# Patient Record
Sex: Male | Born: 1995 | Race: Black or African American | Hispanic: No | Marital: Single | Smoking: Never smoker
Health system: Southern US, Community
[De-identification: ages and names within clinical notes are randomized; demographics above are authoritative.]

## PROBLEM LIST (undated history)

## (undated) DIAGNOSIS — J45909 Unspecified asthma, uncomplicated: Secondary | ICD-10-CM

## (undated) HISTORY — DX: Unspecified asthma, uncomplicated: J45.909

---

## 2014-11-08 ENCOUNTER — Emergency Department (HOSPITAL_COMMUNITY): Payer: BC Managed Care – PPO

## 2014-11-08 ENCOUNTER — Encounter (HOSPITAL_COMMUNITY): Payer: Self-pay | Admitting: Emergency Medicine

## 2014-11-08 ENCOUNTER — Emergency Department (HOSPITAL_COMMUNITY)
Admission: EM | Admit: 2014-11-08 | Discharge: 2014-11-08 | Disposition: A | Discharge status: Home / Self Care | Payer: BC Managed Care – PPO | Attending: Emergency Medicine | Admitting: Emergency Medicine

## 2014-11-08 DIAGNOSIS — T1490XA Injury, unspecified, initial encounter: Secondary | ICD-10-CM

## 2014-11-08 DIAGNOSIS — Y92310 Basketball court as the place of occurrence of the external cause: Secondary | ICD-10-CM | POA: Insufficient documentation

## 2014-11-08 DIAGNOSIS — S022XXA Fracture of nasal bones, initial encounter for closed fracture: Secondary | ICD-10-CM

## 2014-11-08 DIAGNOSIS — S0031XA Abrasion of nose, initial encounter: Secondary | ICD-10-CM | POA: Diagnosis not present

## 2014-11-08 DIAGNOSIS — Y998 Other external cause status: Secondary | ICD-10-CM | POA: Insufficient documentation

## 2014-11-08 DIAGNOSIS — S0993XA Unspecified injury of face, initial encounter: Secondary | ICD-10-CM | POA: Diagnosis present

## 2014-11-08 DIAGNOSIS — Y9367 Activity, basketball: Secondary | ICD-10-CM | POA: Diagnosis not present

## 2014-11-08 DIAGNOSIS — W51XXXA Accidental striking against or bumped into by another person, initial encounter: Secondary | ICD-10-CM | POA: Insufficient documentation

## 2014-11-08 MED ORDER — AMOXICILLIN-POT CLAVULANATE 875-125 MG PO TABS
1.0000 | ORAL_TABLET | Freq: Once | ORAL | Status: AC
  Administered 2014-11-08: 1 via ORAL
  Filled 2014-11-08: qty 1

## 2014-11-08 MED ORDER — AMOXICILLIN-POT CLAVULANATE 875-125 MG PO TABS: 1.0000 | ORAL_TABLET | Freq: Two times a day (BID) | ORAL | Status: DC

## 2014-11-08 NOTE — ED Provider Notes (Signed)
CSN: [id number]     Arrival date & time 11/08/14  1932 History  This chart was scribed for non-physician practitioner, Arman FilterGail K Romy Ipock, NP, working with Purvis SheffieldForrest Harrison, MD, by Modena JanskyAlbert Thayil, ED Scribe. This patient was seen in room WTR5/WTR5 and the patient's care was started at 8:12 PM.   Chief Complaint  Patient presents with  . Facial Injury   The history is provided by the patient. No language interpreter was used.   HPI Comments: Gabriel Barber is a 19 y.o. male who presents to the Emergency Department complaining of a facial injury that occurred about 2 hours ago. He reports that he was elbowed in the nose while playing basketball about 2 hours ago. He denies any LOC. He states that his nose started bleeding from his right nare with pain.   History reviewed. No pertinent past medical history. History reviewed. No pertinent past surgical history. No family history on file. History  Substance Use Topics  . Smoking status: Never Smoker   . Smokeless tobacco: Not on file  . Alcohol Use: No    Review of Systems  HENT: Positive for nosebleeds.   Skin: Positive for wound.  Neurological: Positive for headaches.  All other systems reviewed and are negative.   Allergies  Review of patient's allergies indicates no known allergies.  Home Medications   Prior to Admission medications   Medication Sig Start Date End Date Taking? Authorizing Provider  amoxicillin-clavulanate (AUGMENTIN) 875-125 MG per tablet Take 1 tablet by mouth every 12 (twelve) hours. 11/08/14   Arman FilterGail K Lilymarie Scroggins, NP   BP 134/87 mmHg  Pulse 55  Temp(Src) 97.7 F (36.5 C) (Oral)  Resp 16  Ht 5\' 11"  (1.803 m)  Wt 170 lb (77.111 kg)  BMI 23.72 kg/m2  SpO2 100% Physical Exam  Constitutional: He is oriented to person, place, and time. He appears well-developed and well-nourished. No distress.  HENT:  Head: Normocephalic.  Right Ear: External ear normal.  Left Ear: External ear normal.  Nose: Sinus tenderness  present. No septal deviation or nasal septal hematoma. Epistaxis is observed.    Mouth/Throat: Oropharynx is clear and moist.  Neck: Normal range of motion.  Cardiovascular: Normal rate.   Pulmonary/Chest: Effort normal. No respiratory distress.  Musculoskeletal: Normal range of motion.  Neurological: He is alert and oriented to person, place, and time.  Skin: Skin is warm and dry.  Psychiatric: He has a normal mood and affect.  Nursing note and vitals reviewed.   ED Course  Procedures (including critical care time) DIAGNOSTIC STUDIES: Oxygen Saturation is 100% on RA, normal by my interpretation.    COORDINATION OF CARE: 8:16 PM- Pt advised of plan for treatment which includes radiology and pt agrees.  Labs Review Labs Reviewed - No data to display  Imaging Review Dg Nasal Bones  11/08/2014   CLINICAL DATA:  Nasal pain, elbowed in face playing basketball today, trauma  EXAM: NASAL BONES - 3+ VIEW  COMPARISON:  None  FINDINGS: Nasal septal deviation to the LEFT.  Mildly depressed distal nasal bone fracture.  No additional fracture identified.  Anterior maxillary spine appears intact.  Mucosal thickening LEFT maxillary sinus.  IMPRESSION: Minimally displaced distal nasal bone fracture.   Electronically Signed   By: Ulyses SouthwardMark  Boles M.D.   On: 11/08/2014 21:22     EKG Interpretation None      MDM   Final diagnoses:  Trauma  Nasal bone fracture, closed, initial encounter    I personally performed the services  described in this documentation, which was scribed in my presence. The recorded information has been reviewed and is accurate.    Arman Filter, NP 11/08/14 2158  Purvis Sheffield, MD 11/10/14 [phone]

## 2014-11-08 NOTE — ED Notes (Signed)
Patient talking on cell phone and appears in no acute distress.

## 2014-11-08 NOTE — ED Notes (Signed)
Pt states he was elbowed in nose while playing basketball @ 1755, denies LOC. Deformity noted to bridge of nose, blood dripping from R nare

## 2014-11-08 NOTE — Discharge Instructions (Signed)
Blunt Trauma You have been evaluated for injuries. You have been examined and your caregiver has not found injuries serious enough to require hospitalization. It is common to have multiple bruises and sore muscles following an accident. These tend to feel worse for the first 24 hours. You will feel more stiffness and soreness over the next several hours and worse when you wake up the first morning after your accident. After this point, you should begin to improve with each passing day. The amount of improvement depends on the amount of damage done in the accident. Following your accident, if some part of your body does not work as it should, or if the pain in any area continues to increase, you should return to the Emergency Department for re-evaluation.  HOME CARE INSTRUCTIONS  Routine care for sore areas should include:  Ice to sore areas every 2 hours for 20 minutes while awake for the next 2 days.  Drink extra fluids (not alcohol).  Take a hot or warm shower or bath once or twice a day to increase blood flow to sore muscles. This will help you "limber up".  Activity as tolerated. Lifting may aggravate neck or back pain.  Only take over-the-counter or prescription medicines for pain, discomfort, or fever as directed by your caregiver. Do not use aspirin. This may increase bruising or increase bleeding if there are small areas where this is happening. SEEK IMMEDIATE MEDICAL CARE IF:  Numbness, tingling, weakness, or problem with the use of your arms or legs.  A severe headache is not relieved with medications.  There is a change in bowel or bladder control.  Increasing pain in any areas of the body.  Short of breath or dizzy.  Nauseated, vomiting, or sweating.  Increasing belly (abdominal) discomfort.  Blood in urine, stool, or vomiting blood.  Pain in either shoulder in an area where a shoulder strap would be.  Feelings of lightheadedness or if you have a fainting  episode. Sometimes it is not possible to identify all injuries immediately after the trauma. It is important that you continue to monitor your condition after the emergency department visit. If you feel you are not improving, or improving more slowly than should be expected, call your physician. If you feel your symptoms (problems) are worsening, return to the Emergency Department immediately. Document Released: 07/10/2001 Document Revised: 01/06/2012 Document Reviewed: 06/01/2008 [facility]ExitCare Patient Information 2015 [employer] [place]. This information is not intended to replace advice given to you by your health care provider. Make sure you discuss any questions you have with your health care provider. The distal tip of your nose is broken given, given a referral for follow-up with ENT once the swelling goes down.  You've also been given a prescription for antibiotic to prevent any infection

## 2015-11-29 IMAGING — CR DG NASAL BONES 3+V
3 series · 3 of 3 positions shown · non-contrast
Comparison: None

CLINICAL DATA: Nasal pain, elbowed in face playing basketball
today, trauma

EXAM:
NASAL BONES - 3+ VIEW

[w waters pa]
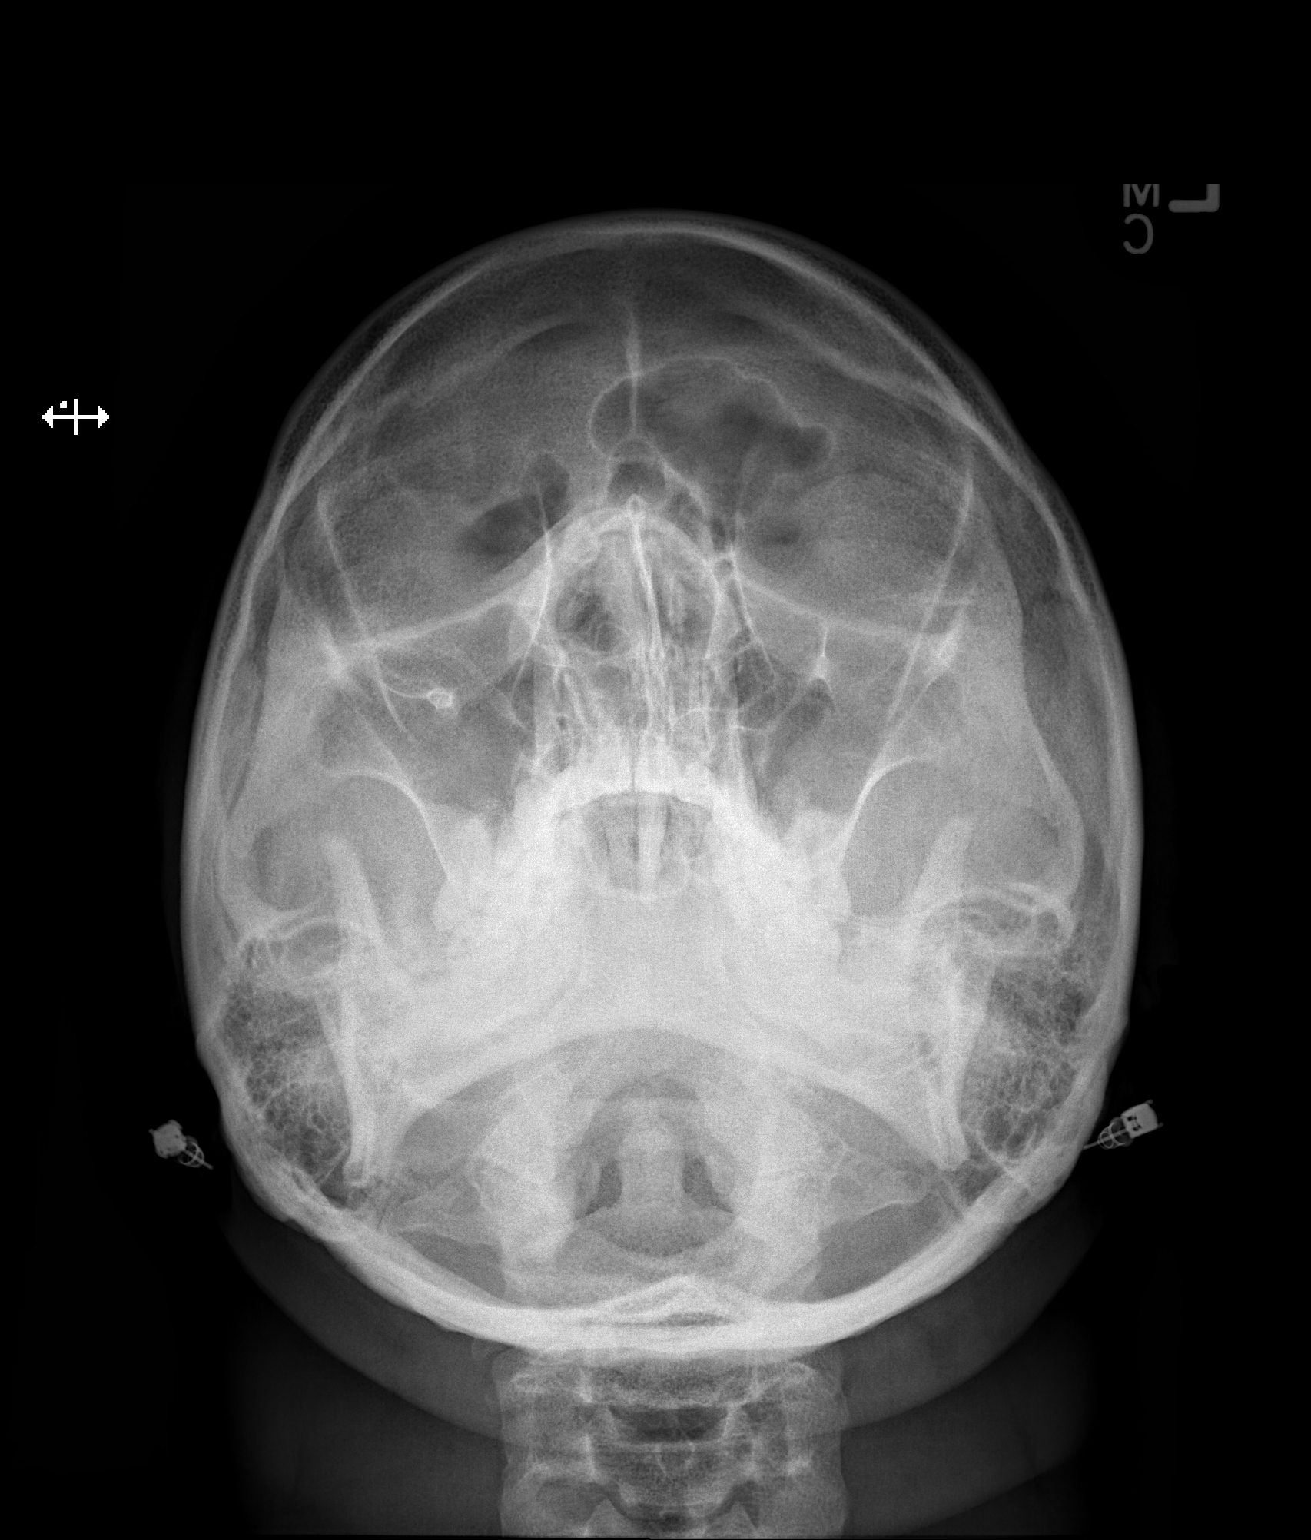

[w nasal bone lat (1 of 2)]
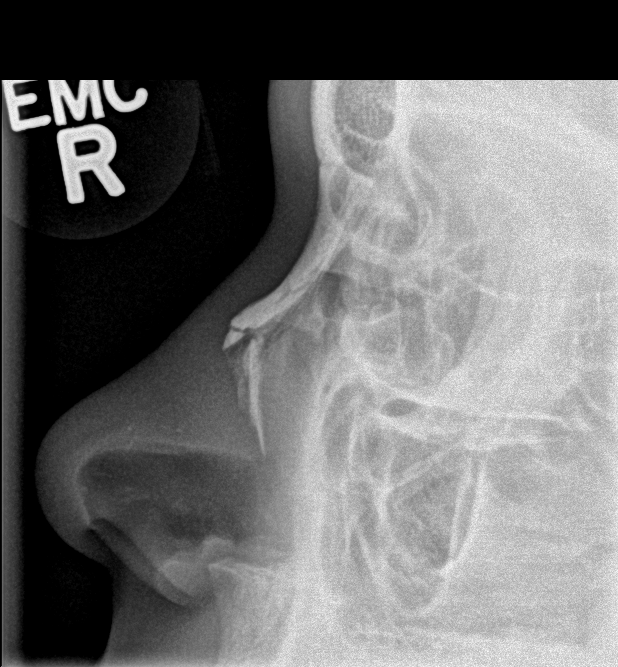

[w nasal bone lat (2 of 2)]
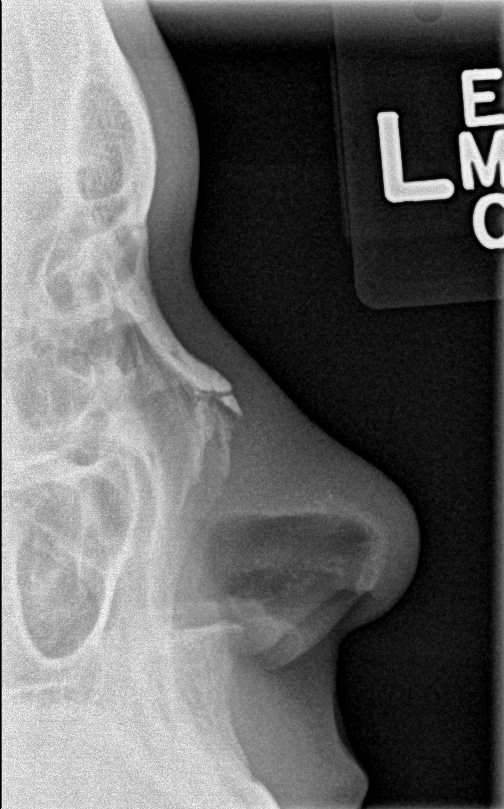

[3 of 3 positions shown; findings below may reference images not displayed]

FINDINGS: Nasal septal deviation to the LEFT.

Mildly depressed distal nasal bone fracture.

No additional fracture identified.

Anterior maxillary spine appears intact.

Mucosal thickening LEFT maxillary sinus.
IMPRESSION: Minimally displaced distal nasal bone fracture.

## 2016-01-21 ENCOUNTER — Ambulatory Visit: Payer: PRIVATE HEALTH INSURANCE

## 2016-01-21 ENCOUNTER — Ambulatory Visit (INDEPENDENT_AMBULATORY_CARE_PROVIDER_SITE_OTHER): Payer: PRIVATE HEALTH INSURANCE

## 2016-01-21 ENCOUNTER — Ambulatory Visit (INDEPENDENT_AMBULATORY_CARE_PROVIDER_SITE_OTHER): Payer: PRIVATE HEALTH INSURANCE | Admitting: Family Medicine

## 2016-01-21 VITALS — BP 110/70 | HR 64 | Temp 98.0°F | Resp 18 | Ht 70.0 in | Wt 178.5 lb

## 2016-01-21 DIAGNOSIS — S99921A Unspecified injury of right foot, initial encounter: Secondary | ICD-10-CM

## 2016-01-21 DIAGNOSIS — L988 Other specified disorders of the skin and subcutaneous tissue: Secondary | ICD-10-CM

## 2016-01-21 MED ORDER — NYSTATIN 100000 UNIT/GM EX POWD: 500000.0000 g | Freq: Three times a day (TID) | CUTANEOUS | Status: DC

## 2016-01-21 NOTE — Progress Notes (Addendum)
Subjective:    Patient ID: Gabriel Barber, male    DOB: [birth year], 20 y.o.   MRN: [id number] By signing my name below, I, Javier Docker, attest that this documentation has been prepared under the direction and in the presence of Norberto Sorenson, MD. Electronically Signed: Javier Docker, ER Scribe. 01/21/2016. 3:13 PM.  Chief Complaint  Patient presents with  . Toe Injury    C/O right toe -Happened Wednesday-has a gash near pinky toe    HPI HPI Comments: Gabriel Barber is a 20 y.o. male who presents to [facility] complaining of an injury to his right fifth toe that happened four days ago. Someone stepped on his fifth toe during a basketball game.    Past Medical History  Diagnosis Date  . Asthma    No Known Allergies  Current Outpatient Prescriptions on File Prior to Visit  Medication Sig Dispense Refill  . amoxicillin-clavulanate (AUGMENTIN) 875-125 MG per tablet Take 1 tablet by mouth every 12 (twelve) hours. (Patient not taking: Reported on 01/21/2016) 14 tablet 0   No current facility-administered medications on file prior to visit.   Review of Systems  Constitutional: Positive for activity change. Negative for fever, chills and appetite change.  Cardiovascular: Negative for leg swelling.  Musculoskeletal: Positive for arthralgias and gait problem. Negative for myalgias and joint swelling.  Skin: Positive for color change, pallor, rash and wound.  Neurological: Negative for weakness and numbness.  Hematological: Negative for adenopathy. Does not bruise/bleed easily.      Objective:  BP 110/70 mmHg  Pulse 64  Temp(Src) 98 F (36.7 C) (Oral)  Resp 18  Ht  (1.778 m)  Wt 178 lb 8 oz (80.967 kg)  BMI 25.61 kg/m2  SpO2 99%  Physical Exam  Constitutional: He is oriented to person, place, and time. He appears well-developed and well-nourished. No distress.  HENT:  Head: Normocephalic and atraumatic.  Eyes: Pupils are equal, round, and reactive to light.  Neck: Neck  supple.  Cardiovascular: Normal rate.   Pulmonary/Chest: Effort normal. No respiratory distress.  Musculoskeletal: Normal range of motion.  Neurological: He is alert and oriented to person, place, and time. Coordination normal.  Skin: Skin is warm and dry. He is not diaphoretic.  Tender to palpation at the distal fifth metatarsal. Maceration between the fourth and fifth toe.   Psychiatric: He has a normal mood and affect. His behavior is normal.  Nursing note and vitals reviewed.    Dg Toe 5th Right  01/21/2016  CLINICAL DATA:  Injured toe four days ago playing basketball EXAM: RIGHT FIFTH TOE COMPARISON:  None. FINDINGS: No fracture dislocation of the fourth or fifth digit on two views IMPRESSION: No fracture or dislocation. Electronically Signed   By: Genevive Bi M.D.   On: 01/21/2016 15:37   Dg Toe Great Right  01/21/2016  CLINICAL DATA:  Injury.  Pain. EXAM: RIGHT GREAT TOE COMPARISON:  None. FINDINGS: There is no evidence of fracture or dislocation. There is no evidence of arthropathy or other focal bone abnormality. Soft tissues are unremarkable. IMPRESSION: Negative. Electronically Signed   By: Elsie Stain M.D.   On: 01/21/2016 15:44    Assessment & Plan:   1. Toe injury, right, initial encounter   2. Maceration of skin   maceration between 4th and 5th toe from buddy taping.  Used topical lidocaine for anesthsia then [place] PA-C debrided dead tissue between that was impeding wound healing - pt tolerated very well, under is healthy pink  granulation tissue - xeroform applied and 5th digit dressed - keep dry, change bandage daily though leave xeroform in place - recheck in 2-3d. Pt coincidently has a very mild athletes foot - moccasin-type - which may be contributing to the maceration so start antifungal.  Foot strain - RICE  The xray of the right first toe was an order error by myself - was meant to be 5th toe only - cancelled xray order for first toe but had already been done  so will need to ensure this is not billed to pt including [facility] radiology overread for 1st toe - will have to be waived or billed to us. .  .  Orders Placed This Encounter  Procedures  . DG Toe 5th Right    Standing Status: Future     Number of Occurrences: 1     Standing Expiration Date: 01/20/2017    Order Specific Question:  Reason for Exam (SYMPTOM  OR DIAGNOSIS REQUIRED)    Answer:  injury    Order Specific Question:  Preferred imaging location?    Answer:  External    Meds ordered this encounter  Medications  . nystatin (MYCOSTATIN/NYSTOP) 100000 UNIT/GM POWD    Sig: Apply 500,000 g topically 3 (three) times daily.    Dispense:  60 g    Refill:  5    I personally performed the services described in this documentation, which was scribed in my presence. The recorded information has been reviewed and considered, and addended by me as needed.  Norberto SorensonEva Nana Vastine, MD MPH

## 2016-01-21 NOTE — Patient Instructions (Addendum)
Wound Debridement, Care After Refer to this sheet in the next few weeks. These instructions provide you with information on caring for yourself after your procedure. Your caregiver may also give you more specific instructions. Your treatment has been planned according to current medical practices, but problems sometimes occur. Call your caregiver if you have any problems or questions after your procedure.  HOME CARE INSTRUCTIONS   Only take over-the-counter or prescription medicines as directed by your caregiver. Do not take any other medicines without asking your caregiver first.  If antibiotic medicines are prescribed, take them as directed. Finish them even if you start to feel better.  Care for your wound as directed by your caregiver.  Wash your hands thoroughly before and after changing your dressings.  Keep the wound and bandages (dressings) clean and dry. Change dressings as directed.  Apply any prescribed medicines to the wound as directed. Do not use anything else without asking your caregiver first.  Do not shower or bathe until your caregiver approves. Take sponge baths until you get approval.  Limit activities or movements as directed by your caregiver.  Protect the wound from further injury until it is healed.  Do not drive until you are no longer taking pain medicine or until your caregiver approves.  Keep all follow-up appointments.  SEEK MEDICAL CARE IF:   You have pain even after taking pain medicine.  You have trouble changing your dressings. SEEK IMMEDIATE MEDICAL CARE IF:  Your pain increases.   You have a fever. MAKE SURE YOU:  Understand these instructions.  Will watch your condition.  Will get help right away if you are not doing well or get worse.   This information is not intended to replace advice given to you by your health care provider. Make sure you discuss any questions you have with your health care provider.   Document Released:  09/30/2012 Document Revised: 11/04/2014 Document Reviewed: 02/22/2015 Elsevier Interactive Patient Education 2016 [employer].    IF you received an x-ray today, you will receive an invoice from [facility]. Please contact [facility] at [phone] with questions or concerns regarding your invoice.   IF you received labwork today, you will receive an invoice from [employer]. Please contact [facility] at [phone] with questions or concerns regarding your invoice.   Our billing staff will not be able to assist you with questions regarding bills from these companies.  You will be contacted with the lab results as soon as they are available. The fastest way to get your results is to activate your My Chart account. Instructions are located on the last page of this paperwork. If you have not heard from [place] regarding the results in 2 weeks, please contact this office.     Burow's (1% aluminum acetate or 5% aluminum subacetate) wet dressings, applied for 20 minutes two to three times per day, or placing gauze or cotton between toes may be helpful as an adjunctive measure for patients with vesiculation or maceration.   Athlete's Foot Athlete's foot (tinea pedis) is a fungal infection of the skin on the feet. It often occurs on the skin between the toes or underneath the toes. It can also occur on the soles of the feet. Athlete's foot is more likely to occur in hot, humid weather. Not washing your feet or changing your socks often enough can contribute to athlete's foot. The infection can spread from person to person (contagious). CAUSES Athlete's foot is caused by a fungus.  This fungus thrives in warm, moist places. Most people get athlete's foot by sharing shower stalls, towels, and wet floors with an infected person. People with weakened immune systems, including those with diabetes, may be more likely to get athlete's foot. SYMPTOMS   Itchy areas  between the toes or on the soles of the feet.  White, flaky, or scaly areas between the toes or on the soles of the feet.  Tiny, intensely itchy blisters between the toes or on the soles of the feet.  Tiny cuts on the skin. These cuts can develop a bacterial infection.  Thick or discolored toenails. DIAGNOSIS  Your caregiver can usually tell what the problem is by doing a physical exam. Your caregiver may also take a skin sample from the rash area. The skin sample may be examined under a microscope, or it may be tested to see if fungus will grow in the sample. A sample may also be taken from your toenail for testing. TREATMENT  Over-the-counter and prescription medicines can be used to kill the fungus. These medicines are available as powders or creams. Your caregiver can suggest medicines for you. Fungal infections respond slowly to treatment. You may need to continue using your medicine for several weeks. PREVENTION   Do not share towels.  Wear sandals in wet areas, such as shared locker rooms and shared showers.  Keep your feet dry. Wear shoes that allow air to circulate. Wear cotton or wool socks. HOME CARE INSTRUCTIONS   Take medicines as directed by your caregiver. Do not use steroid creams on athlete's foot.  Keep your feet clean and cool. Wash your feet daily and dry them thoroughly, especially between your toes.  Change your socks every day. Wear cotton or wool socks. In hot climates, you may need to change your socks 2 to 3 times per day.  Wear sandals or canvas tennis shoes with good air circulation.  If you have blisters, soak your feet in Burow's solution or Epsom salts for 20 to 30 minutes, 2 times a day to dry out the blisters. Make sure you dry your feet thoroughly afterward. SEEK MEDICAL CARE IF:   You have a fever.  You have swelling, soreness, warmth, or redness in your foot.  You are not getting better after 7 days of treatment.  You are not completely cured  after 30 days.  You have any problems caused by your medicines. MAKE SURE YOU:   Understand these instructions.  Will watch your condition.  Will get help right away if you are not doing well or get worse.   This information is not intended to replace advice given to you by your health care provider. Make sure you discuss any questions you have with your health care provider.   Document Released: 10/11/2000 Document Revised: 01/06/2012 Document Reviewed: 04/17/2015 Elsevier Interactive Patient Education [employer] Elsevier Inc.

## 2016-01-24 ENCOUNTER — Ambulatory Visit (INDEPENDENT_AMBULATORY_CARE_PROVIDER_SITE_OTHER): Payer: PRIVATE HEALTH INSURANCE | Admitting: Physician Assistant

## 2016-01-24 VITALS — BP 136/88 | HR 56 | Temp 98.2°F | Resp 16 | Ht 70.0 in | Wt 180.0 lb

## 2016-01-24 DIAGNOSIS — Z5189 Encounter for other specified aftercare: Secondary | ICD-10-CM | POA: Diagnosis not present

## 2016-01-24 DIAGNOSIS — L988 Other specified disorders of the skin and subcutaneous tissue: Secondary | ICD-10-CM | POA: Diagnosis not present

## 2016-01-24 NOTE — Patient Instructions (Signed)
     IF you received an x-ray today, you will receive an invoice from [facility]. Please contact [facility] at [phone] with questions or concerns regarding your invoice.   IF you received labwork today, you will receive an invoice from [facility]. Please contact [facility] at [phone] with questions or concerns regarding your invoice.   Our billing staff will not be able to assist you with questions regarding bills from these companies.  You will be contacted with the lab results as soon as they are available. The fastest way to get your results is to activate your My Chart account. Instructions are located on the last page of this paperwork. If you have not heard from us regarding the results in 2 weeks, please contact this office.      

## 2016-01-24 NOTE — Progress Notes (Signed)
   01/24/2016 11:40 AM   DOB: [birth year] / MRN: [id number]  SUBJECTIVE:  Gabriel Barber is a 20 y.o. male presenting for recheck of a right toe injury sustained on 3/26.  Rads were negative at that time.  He was diagnosed with skin maceration.  Today he reports feeling much better.  Reports the bandage is still in place for his last visit.    He has No Known Allergies.   He  has a past medical history of Asthma.    He  reports that he has never smoked. He does not have any smokeless tobacco history on file. He reports that he drinks about 1.2 oz of alcohol per week. He reports that he does not use illicit drugs. He  has no sexual activity history on file. The patient  has no past surgical history on file.  His family history is not on file.  Review of Systems  Constitutional: Negative for fever.  Gastrointestinal: Negative for nausea.  Musculoskeletal: Negative for myalgias and joint pain.  Skin: Negative for rash.    Problem list and medications reviewed and updated by myself where necessary, and exist elsewhere in the encounter.   OBJECTIVE:  BP 136/88 mmHg  Pulse 56  Temp(Src) 98.2 F (36.8 C) (Oral)  Resp 16  Ht 5\' 10"  (1.778 m)  Wt 180 lb (81.647 kg)  BMI 25.83 kg/m2  SpO2 98%  Physical Exam  Constitutional: He is oriented to person, place, and time. He appears well-developed. He does not appear ill.  Eyes: Conjunctivae and EOM are normal. Pupils are equal, round, and reactive to light.  Cardiovascular: Normal rate and regular rhythm.   Pulmonary/Chest: Effort normal and breath sounds normal.  Abdominal: He exhibits no distension.  Musculoskeletal: Normal range of motion.       Feet:  Neurological: He is alert and oriented to person, place, and time. No cranial nerve deficit. Coordination normal.  Skin: Skin is warm and dry. He is not diaphoretic.  Psychiatric: He has a normal mood and affect.  Nursing note and vitals reviewed.   No results found for this or any  previous visit (from the past 72 hour(s)).  No results found.  ASSESSMENT AND PLAN  Gabriel Barber was seen today for follow-up.  Diagnoses and all orders for this visit:  Maceration of skin: The wound looks good.  No signs of infection.  No pain with ambulation.  Gait normal.  I have given him some wound supplies and showed him how to dress and bandage the wound.  He will use neosporin topically.  RTC PRN.    Encounter for wound care: See problem 1.   The patient was advised to call or return to clinic if he does not see an improvement in symptoms or to seek the care of the closest emergency department if he worsens with the above plan.   Deliah BostonMichael Clark, MHS, PA-C [facility] 01/24/2016 11:40 AM

## 2017-02-10 IMAGING — CR DG TOE 5TH 2+V*R*
2 series · 2 of 2 positions shown · non-contrast
Comparison: None.

CLINICAL DATA: Injured toe four days ago playing basketball

EXAM:
RIGHT FIFTH TOE

[AP]
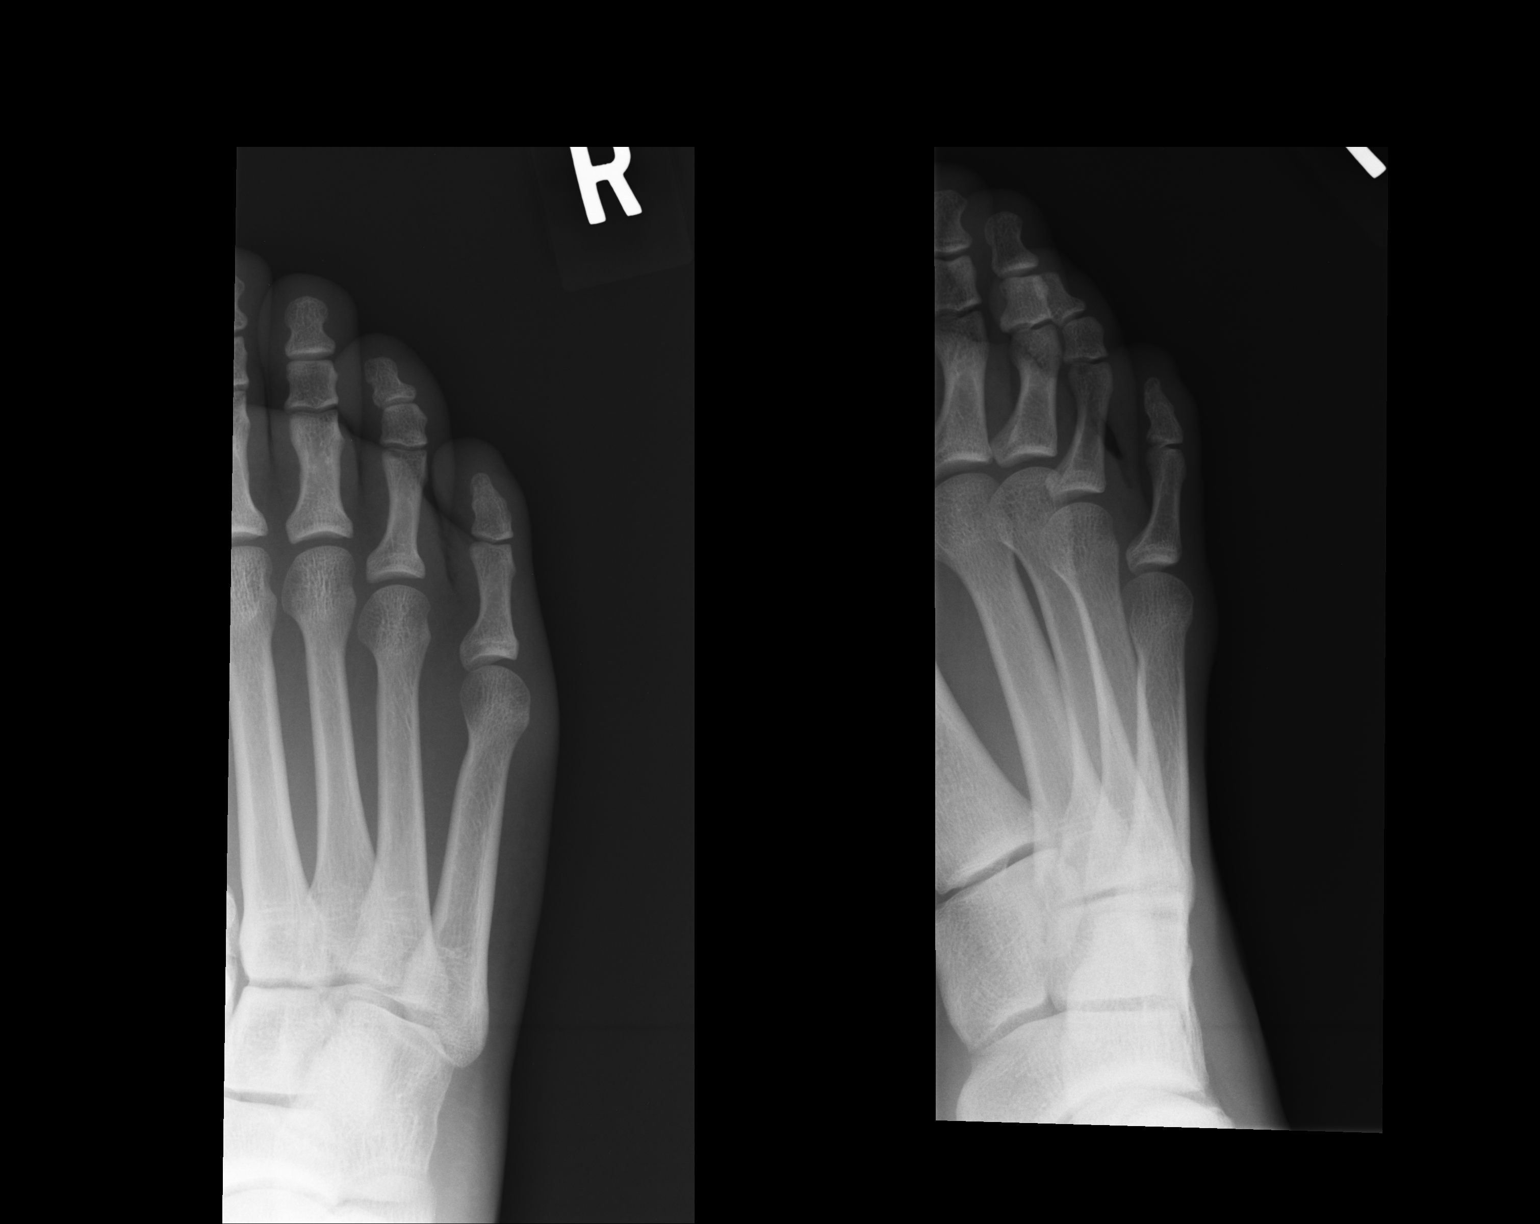

[lateral]
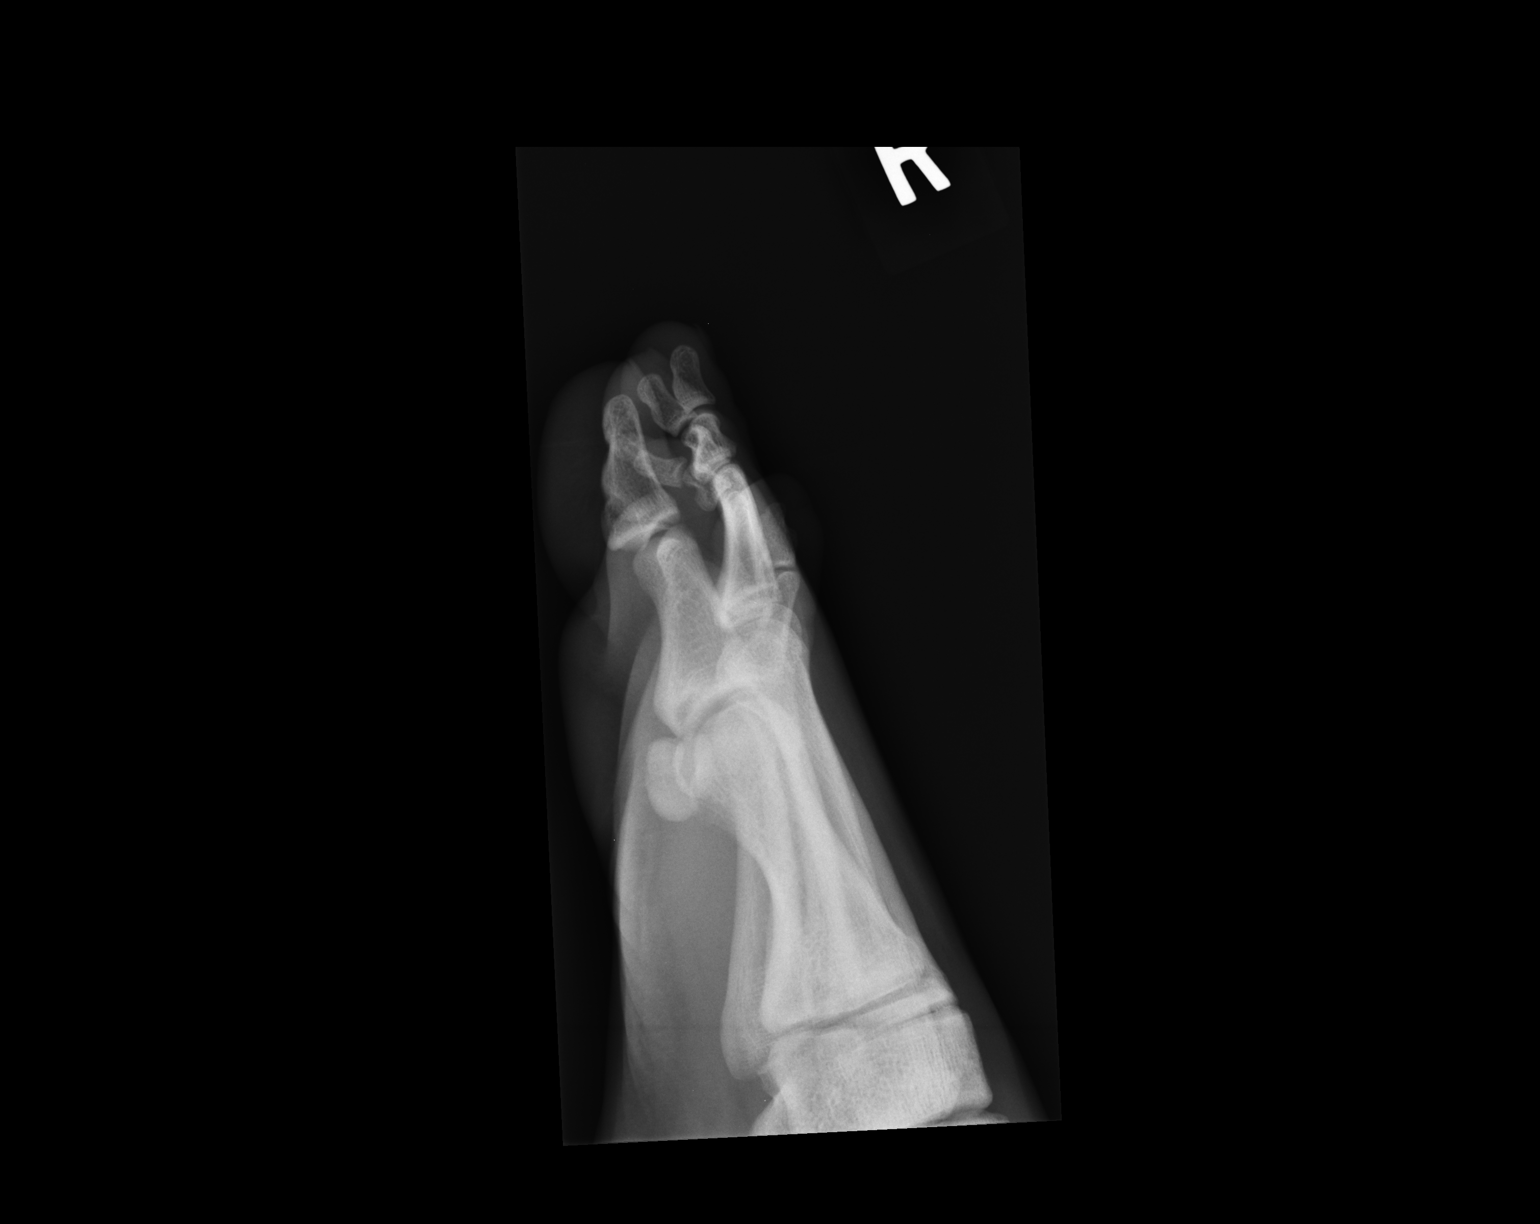

[2 of 2 positions shown; findings below may reference images not displayed]

FINDINGS: No fracture dislocation of the fourth or fifth digit on two views
IMPRESSION: No fracture or dislocation.

## 2022-12-03 ENCOUNTER — Ambulatory Visit: Payer: BLUE CROSS/BLUE SHIELD

## 2022-12-04 ENCOUNTER — Inpatient Hospital Stay
Admit: 2022-12-04 | Discharge: 2022-12-04 | Disposition: A | Discharge status: Home / Self Care | Payer: BLUE CROSS/BLUE SHIELD | Source: Home / Self Care

## 2022-12-04 DIAGNOSIS — S86012A Strain of left Achilles tendon, initial encounter: Secondary | ICD-10-CM

## 2022-12-04 MED ADMIN — IBUPROFEN 600 MG PO TABS: 600 mg | ORAL | @ 10:00:00 | Stop: 2022-12-04 | NDC 60687045701

## 2022-12-04 NOTE — Discharge Instructions
Your Achilles tendon was found to be ruptured today.  You were placed in his splint and given crutches.  Do not bear any weight on your left leg.  Keep the splint dry at all times.  Use ibuprofen 600 mg every 8 hours for pain and swelling.  Elevate your leg at night to reduce swelling and pain.  You will be called by the case managers to arrange for a follow-up appointment with Orthopedic surgery in 1 week.

## 2022-12-04 NOTE — ED Provider Notes
Zachary Peck  Emergency Department Service Report    Triage     Zachary Peck, a 27 y.o. male, presents with Foot and Foot Problem (Self reports heard ''pop, achilles tear'' endorses pain to LLE, unable to bear weight, arrived in w/c )    Arrived on 12/03/2022 at 10:18 PM   Arrived by Wheelchair [4]    ED Triage Vitals   Temp Temp Source BP Heart Rate Resp SpO2 O2 Device Pain Score Weight   12/03/22 2225 12/04/22 0216 12/03/22 2225 12/03/22 2225 12/03/22 2225 12/03/22 2225 12/03/22 2225 12/03/22 2225 12/03/22 2225   37 ?C (98.6 ?F) Temporal 148/103 92 20 100 % None (Room air) Five 86.2 kg (190 lb)       No Known Allergies     Initial Physician Contact       Comprehensive Exam Initiated  Contact Date: 12/04/22  Contact Time: 0138    History   HPI  27 year old male 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  Sudden onset of sharp pain to the Achilles region while running.  No fall or secondary injury.  Not on anticoagulation.  No numbness or paresthesias to the distal extremity.    History reviewed. No pertinent past medical history.     No past surgical history on file.     Past Family History   family history is not on file.     Past Social History   he has no history on file for tobacco use, alcohol use, drug use, and sexual activity.       Physical Exam   Physical Exam  Vitals and nursing note reviewed.   Constitutional:       General: He is not in acute distress.     Appearance: Normal appearance. He is not ill-appearing.   HENT:      Head: Normocephalic and atraumatic.      Right Ear: External ear normal.      Left Ear: External ear normal.      Nose: Nose normal.      Mouth/Throat:      Mouth: Mucous membranes are moist.      Pharynx: Oropharynx is clear.   Eyes:      General: No scleral icterus.     Extraocular Movements: Extraocular movements intact.   Cardiovascular:      Rate and Rhythm: Normal rate.      Pulses: Normal pulses. Pulmonary:      Effort: Pulmonary effort is normal. No respiratory distress.      Breath sounds: Normal breath sounds.   Abdominal:      General: Abdomen is flat. There is no distension.      Palpations: Abdomen is soft.      Tenderness: There is no abdominal tenderness. There is no guarding or rebound.   Musculoskeletal:         General: Normal range of motion.      Cervical back: Normal range of motion.      Right lower leg: No edema.      Left lower leg: No edema.      Comments: +Thompson test L calf  No significant swelling or muscle belly shortening.  No ecchymosis.  No tenderness throughout the ankle and foot.   Skin:     General: Skin is warm and dry.      Capillary Refill: Capillary refill takes less than 2 seconds.  Coloration: Skin is not pale.      Findings: No rash.   Neurological:      General: No focal deficit present.      Mental Status: He is alert and oriented to person, place, and time. Mental status is at baseline.      Sensory: No sensory deficit.      Comments: Inability to plantar flex, 5/5 plantar extension LLE   Psychiatric:      Comments: Pleasant.           Medical Decision Making   Zachary Peck is a 27 y.o. male presents today with left Achilles tendon pain while playing basketball.  Vital signs stable, physical exam is consistent with Achilles tendon rupture, query moderate in severity.  Neurovascularly intact.  X-ray of the ankle is negative for acute fracture or dislocation.  He was given ibuprofen for pain and splinted in mild plantar flexion and given crutches.  Counseled on nonweightbearing and case managers to arrange for 1 week follow up with Orthopedic surgery.  Counseled on RICE therapy.    Medical Decision Making  Amount and/or Complexity of Data Reviewed  Radiology: ordered. Decision-making details documented in ED Course.    Risk  Prescription drug management.             Progress Notes / Reassessments     ED Course as of 12/04/22 0541   Wed Dec 04, 2022   0117 XR ankle ap+lat+mortise left (3 views)  MPRESSION:     No acute fracture or dislocation. The ankle mortise is intact. No significant soft tissue swelling.  There is ossification in the region of the deltoid ligament inferior to the medial malleolus, which may represent old trauma.   [CB]      ED Course User Index  [CB] Miciah Shealy, Alcario Drought., MD              ED Course      Laboratory Results   Labs Reviewed - No data to display    Imaging Results     XR ankle ap+lat+mortise left (3 views)   Final Result by Axel Filler., MD (02/07 0029)   IMPRESSION:      No acute fracture or dislocation. The ankle mortise is intact. No significant soft tissue swelling.   There is ossification in the region of the deltoid ligament inferior to the medial malleolus, which may represent old trauma.      I, Lane Hacker, M.D., have reviewed this radiological study personally and I am in full agreement with the findings of the report presented here.      Dictated by: Sinclair Ship   12/03/2022 11:42 PM      Signed by: Lane Hacker   12/04/2022 12:29 AM          Consults     Consult Orders Placed This Encounter       None            Clnical Impression     1. Achilles tendon rupture, left, initial encounter          Disposition and Follow-up   Disposition: Discharge [1]     No future appointments.    Follow up with:  No follow-up provider specified.    Return precautions are specified on After Visit Summary.    There are no discharge medications for this patient.      Medications Administered This Encounter           Status .  ibuprofen tab 600 mg  STAT         Last MAR action: Given               Resident Signature          Deneen Harts., MD  Resident  12/04/22 [phone]

## 2022-12-04 NOTE — ED Notes
Patient is medically cleared for discharge by MD, a/ox4, gcs 15, no acute distress, ambulatory with crutches, circulation, sensation and motor function are intact. Discharge paperwork given to patient, discharge instructions, follow ups and pain management discussed with the patient. Patient verbalized understanding of information given.

## 2022-12-04 NOTE — Consults
DISCHARGE NEEDS      []  Expedited Appointment    Requesting Treatment Team:   Primary - Resident: Amie Portland., MD   Primary Attending :  Consepcion Hearing., MD   Request: Sports medicine or Ortho surgery f/u 1 week   Name: Zachary Peck   MRN: [id number]   Insurance:   Payor: [place] / Plan: [place] / Product Type: PPO /    ED Chief complaint- Foot and Foot Problem  Diagnosis:  1. Achilles tendon rupture, left, initial encounter    Contact number:  [phone]   Time Frame:  Within a 1 week timeframe     Lorayne Marek RN  Case manager  [phone]

## 2022-12-11 ENCOUNTER — Ambulatory Visit: Payer: BLUE CROSS/BLUE SHIELD

## 2022-12-11 DIAGNOSIS — S86002A Unspecified injury of left Achilles tendon, initial encounter: Secondary | ICD-10-CM

## 2022-12-12 NOTE — Procedures
Patient was referred for application of a short leg cast in plantar flexion per doctors orders. [place], web roll and fiberglass were applied. The patient tolerated the procedure well. Satisfactory capillary refill was observed. Cast care instructions were given and all questions were satisfactorily answered.

## 2022-12-12 NOTE — Progress Notes
[place] Department of Orthopaedic Surgery  Division of Sports Medicine    SPORTS MEDICINE CONSULTATION        Date: 12/15/2022    Referred by:  Cleotis Lema., Md  [address]  [address]  [place],  [place] [address]  Primary Care Physician:  [facility]    Chief Complaint:   Chief Complaint   Patient presents with    Left Foot - Pain, New Consult     Partial tear in ACL which was told to him by [facility] ER       HISTORY:      Zachary Peck is a 27 y.o. male presenting with follow up for suspected L achilles tendon injury. Went to ER 1 week ago (2/7) for acute injury playing intramural basketball. Diagnosed with likely achilles injury and splinted in plantar flexion. States pain is manageable and swelling improved but never a very large deformity. Has not been weightbearing.       Past Medical History: I have reviewed and confirmed the past medical history in the chart.  No past medical history on file.    Past Surgical History: I have reviewed and confirmed the past surgical history in the chart.  No past surgical history on file.    Medications: Reviewed medication list in the chart  No outpatient medications prior to visit.     No facility-administered medications prior to visit.       Allergies: Reviewed allergy section in the chart  No Known Allergies    Family History:   No family history on file.    Social History:   Social History     Socioeconomic History    Marital status: Single       Review of Systems: A 14-point review of systems was performed.  With the exception of the HPI, all other review of systems was negative.     PHYSICAL EXAM:     Vitals: Ht 5' 11'' (1.803 m)  ~ Wt 180 lb (81.6 kg)  ~ BMI 25.10 kg/m?   General: NAD, pleasant & cooperative  Head: EOMI, no facial lesions  Neuro: Normal sensation, no motor deficits  Skin: No ulcers, erythema, or skin breakdown  Psych: Alert, appropriate affect  MSK:  LLE neurovascularly intact. No significant joint effusion. Thompson test positive. No obvious posterior heel stepoff or deformity. No bony tenderness in ankle or knee. Nonweightbearing, but 5/5 strength proximally.       IMAGING:     Xrays L ankle 2/6 personally reviewed by me with no e/o fracture or dislocation. Mild deltoid calcific tendinosis. No significant effusion.     I reviewed the imaging.    ASSESSMENT:     Zachary Peck is a 27 y.o. male who presents with history and examination consistent with acute achilles rupture.     Most likely isolated achilles rupture based on history and exam. No e/o osseous abnormality doubt fracture or dislocation.No complication of neurovascular compromise. Has been compliant with splinting. Based on risks benefits conversation, patient would prefer to undergo non operative management and opts for serial functional casting. As injury occurred 1 week ago, will continue with cast for additional 1 week prior and follow up at that time.    RECOMMENDATIONS & PLAN:     Given our clinical suspicion of the above diagnosis, our initial plan is as follows:  Cast today, return in one week for serial functional casting pathway   RICE aftercare  instructed and pain control PRN     Diagnoses and all orders for this visit:    Unspecified injury of left Achilles tendon, initial encounter  -     ORTHO SHORT LEG CAST, ADULT (11 YRS+) FIBERGLASS; Future; Expected date: 12/11/2022  -     CAST REMOVAL PROCEDURE; Future; Expected date: 12/18/2022        Follow-Up: Return in about 1 week (around 12/18/2022).    Midge Aver, MD PhD  [facility]   Division of Sports Medicine  Department of Orthopedic Surgery    Time:  I spent a total of 30 minutes today to provide care for this patient.   This includes time spent prior to, during, and after the patient's appointment to:  Review the patient's past medical history as well as any relevant prior testing/laboratory/imaging results in preparation for the visit.   Obtain an adequate history and understanding of the patient's chief complaint.   Perform the necessary examination and/or order any tests, labs, or imaging for further evaluation.   Discuss the plan and any differential/diagnoses with the patient. Counsel/educate the patient if needed.    Coordinate care for the patient if needed.

## 2022-12-18 ENCOUNTER — Ambulatory Visit: Payer: BLUE CROSS/BLUE SHIELD

## 2022-12-18 DIAGNOSIS — S86002A Unspecified injury of left Achilles tendon, initial encounter: Secondary | ICD-10-CM

## 2022-12-18 NOTE — Procedures
Patient presents for removal of a short leg cast per doctors orders. Procedure regarding the cast saw was explained to the patient/guardian. The patient/guardian was informed of possible discomfort after the removal of the cast. Patient tolerated the procedure well.

## 2022-12-18 NOTE — Progress Notes
Zachary Peck    [facility] Department of Orthopaedic Surgery  Division of Sports Medicine    SPORTS MEDICINE CONSULTATION        Date: 12/18/2022    Referred by:  Cleotis Lema., Md  [address]  [address]  [place],  [place] [address]  Primary Care Physician:  [facility] [place] [facility] [place]    Chief Complaint: L achilles tendon injury    HISTORY:      Zachary Peck is a 27 y.o. male presenting with follow up for suspected L achilles tendon injury. Went to ER 1 week ago (2/7) for acute injury playing intramural basketball. Diagnosed with likely achilles injury and splinted in plantar flexion. States pain is manageable and swelling improved but never a very large deformity. Has not been weightbearing.  I placed him in plantar flexion for another week. Today he is 2 wk s/p initial plantarflexion w nwb.      Past Medical History: I have reviewed and confirmed the past medical history in the chart.  No past medical history on file.    Past Surgical History: I have reviewed and confirmed the past surgical history in the chart.  No past surgical history on file.    Medications: Reviewed medication list in the chart  No outpatient medications prior to visit.     No facility-administered medications prior to visit.       Allergies: Reviewed allergy section in the chart  No Known Allergies    Family History:   No family history on file.    Social History:   Social History     Socioeconomic History    Marital status: Single       Review of Systems: A 14-point review of systems was performed.  With the exception of the HPI, all other review of systems was negative.     PHYSICAL EXAM:     Vitals: There were no vitals taken for this visit.  General: NAD, pleasant & cooperative  Head: EOMI, no facial lesions  Neuro: Normal sensation, no motor deficits  Skin: No ulcers, erythema, or skin breakdown  Psych: Alert, appropriate affect  MSK:  Deferred today    IMAGING:     XR L ankle 12/04/22   No acute fracture or dislocation. The ankle mortise is intact. No significant soft tissue swelling.  There is ossification in the region of the deltoid ligament inferior to the medial malleolus, which may represent old trauma.    I reviewed the imaging.    ASSESSMENT:     Zachary Peck is a 27 y.o. male who presents with history and examination consistent with achilles tendon rupture sustained 2/7.  F.u in 3 wk (at the 5 wk point). Start PT 6 wk post injury.    PLAN  Cast for 2 to 3 weeks in plantarflexion, then transition to CAM walker boot with 3 heel wedges  Non-weight bearing (NWB) for 4 weeks after injury  Start weight bearing at 4 weeks after injury  Wean out of wedges in CAM boot, 1 per week starting at 5 to 6 weeks after injury  Begin physical therapy at ~5 to 6 weeks after injury, dorsiflexion limited to maximum of neutral position  May wean out of the CAM walker boot at 6 to 8 weeks after injury under direction of physical therapist  Begin full range of motion (ROM), including dorsiflexion past neutral at 6 to 8 weeks after injury   No strengthening with resistance  until ~10 weeks after injury       RECOMMENDATIONS & PLAN:     Given our clinical suspicion of the above diagnosis, our initial plan is as follows:    Diagnoses and all orders for this visit:    Unspecified injury of left Achilles tendon, initial encounter  -     CAST REMOVAL PROCEDURE  -     Referral to Rehabilitation, Physical Therapy        Follow-Up: 3 wk    Midge Aver, MD PhD  [facility]   Division of Sports Medicine  Department of Orthopedic Surgery    Time:  I spent a total of 30 minutes today to provide care for this patient.   This includes time spent prior to, during, and after the patient's appointment to:  Review the patient's past medical history as well as any relevant prior testing/laboratory/imaging results in preparation for the visit.   Obtain an adequate history and understanding of the patient's chief complaint.   Perform the necessary examination and/or order any tests, labs, or imaging for further evaluation.   Discuss the plan and any differential/diagnoses with the patient. Counsel/educate the patient if needed.    Coordinate care for the patient if needed.

## 2022-12-23 ENCOUNTER — Ambulatory Visit: Payer: BLUE CROSS/BLUE SHIELD

## 2022-12-26 ENCOUNTER — Ambulatory Visit: Payer: BLUE CROSS/BLUE SHIELD

## 2023-01-07 ENCOUNTER — Telehealth: Payer: BLUE CROSS/BLUE SHIELD

## 2023-01-07 NOTE — Telephone Encounter
Message to Practice/Provider      Message: Ivar Drape from [facility] called to obtain the PT order for patient that is scheduled with their office on 03/14. Please fax to [phone]. Thank you.      Return call is not being requested by the patient or caller.    Patient or caller has been notified of the turnaround time of 1-2 business day(s).

## 2023-01-08 ENCOUNTER — Ambulatory Visit: Payer: BLUE CROSS/BLUE SHIELD

## 2023-01-08 DIAGNOSIS — S86012D Strain of left Achilles tendon, subsequent encounter: Secondary | ICD-10-CM

## 2023-01-08 NOTE — Progress Notes
[facility] Department of Orthopaedic Surgery  Division of Sports Medicine    SPORTS MEDICINE CONSULTATION        Date: 01/08/2023    Referred by:  Cleotis Lema., Md  [address]  [address]  [place],  [place] [address]  Primary Care Physician:  [facility]    Chief Complaint:   Chief Complaint   Patient presents with    Left Ankle - Follow-up     Achilles       HISTORY:      Zachary Peck is a 27 y.o. male presenting with follow up for suspected L achilles tendon injury. Went to ER  (2/7) for acute injury playing intramural basketball. Diagnosed with likely achilles injury and splinted in plantar flexion. I placed him in plantar flexion for another week. Then transitioned to CAM boot w wedges.  He presents for 5 wk follow up.       Past Medical History: I have reviewed and confirmed the past medical history in the chart.  No past medical history on file.    Past Surgical History: I have reviewed and confirmed the past surgical history in the chart.  No past surgical history on file.    Medications: Reviewed medication list in the chart  No outpatient medications prior to visit.     No facility-administered medications prior to visit.       Allergies: Reviewed allergy section in the chart  No Known Allergies    Family History:   No family history on file.    Social History:   Social History     Socioeconomic History    Marital status: Single       Review of Systems: A 14-point review of systems was performed.  With the exception of the HPI, all other review of systems was negative.     PHYSICAL EXAM:     Vitals: There were no vitals taken for this visit.  General: NAD, pleasant & cooperative  Head: EOMI, no facial lesions  Neuro: Normal sensation, no motor deficits  Skin: No ulcers, erythema, or skin breakdown  Psych: Alert, appropriate affect  MSK:  L ankle - no swelling. Neg thompson    IMAGING:       XR L ankle 12/04/22   No acute fracture or dislocation. The ankle mortise is intact. No significant soft tissue swelling.  There is ossification in the region of the deltoid ligament inferior to the medial malleolus, which may represent old trauma.       I reviewed the imaging.    ASSESSMENT:     Zachary Peck is a 27 y.o. male who presents with history and examination consistent with achilles tendon rupture sustained 2/7. Next wed he will remove one wedge. The next wed remove one more wedge. I will see him one week after that to advance.  For PT, stressed not going past 90.     PLAN  Cast for 2 to 3 weeks in plantarflexion, then transition to CAM walker boot w wedges  Non-weight bearing (NWB) for 4 weeks after injury  Started WB at 5 wk w wedges.  Wean out of wedges in CAM boot, 1 per week starting at 5 to 6 weeks after injury  Begin physical therapy at ~5 to 6 weeks after injury, dorsiflexion limited to maximum of neutral position  May wean out of the CAM walker boot at 6 to 8 weeks after injury   Begin  full range of motion (ROM), including dorsiflexion past neutral at 6 to 8 weeks after injury   No strengthening with resistance until ~10 weeks after injury     RECOMMENDATIONS & PLAN:     Given our clinical suspicion of the above diagnosis, our initial plan is as follows:    Diagnoses and all orders for this visit:    Rupture of left Achilles tendon, subsequent encounter        Follow-Up: Return in about 3 weeks (around 01/29/2023).    Midge Aver, MD PhD  [facility]   Division of Sports Medicine  Department of Orthopedic Surgery    Time:  I spent a total of 30 minutes today to provide care for this patient.   This includes time spent prior to, during, and after the patient's appointment to:  Review the patient's past medical history as well as any relevant prior testing/laboratory/imaging results in preparation for the visit.   Obtain an adequate history and understanding of the patient's chief complaint.   Perform the necessary examination and/or order any tests, labs, or imaging for further evaluation.   Discuss the plan and any differential/diagnoses with the patient. Counsel/educate the patient if needed.    Coordinate care for the patient if needed.

## 2023-01-29 ENCOUNTER — Ambulatory Visit: Payer: BLUE CROSS/BLUE SHIELD

## 2023-01-29 DIAGNOSIS — S86012D Strain of left Achilles tendon, subsequent encounter: Secondary | ICD-10-CM

## 2023-01-29 NOTE — Progress Notes
[place] Department of Orthopaedic Surgery  Division of Sports Medicine    SPORTS MEDICINE CONSULTATION        Date: 01/29/2023    Referred by:  No referring provider defined for this encounter.  Primary Care Physician:  [facility] MD    Chief Complaint:   Chief Complaint   Patient presents with    left achilles follow up       HISTORY:      Zachary Peck is a 27 y.o. male presenting with follow up for suspected L achilles tendon injury. Went to ER  (2/7) for acute injury playing intramural basketball. Diagnosed with likely achilles injury and splinted in plantar flexion. I placed him in plantar flexion for another week. Then transitioned to CAM boot w wedges, weaned wedges.  He presents for 8 wk follow up. Doing well. Started PT      Past Medical History: I have reviewed and confirmed the past medical history in the chart.  No past medical history on file.    Past Surgical History: I have reviewed and confirmed the past surgical history in the chart.  No past surgical history on file.    Medications: Reviewed medication list in the chart  No outpatient medications prior to visit.     No facility-administered medications prior to visit.       Allergies: Reviewed allergy section in the chart  No Known Allergies    Family History:   No family history on file.    Social History:   Social History     Socioeconomic History    Marital status: Single       Review of Systems: A 14-point review of systems was performed.  With the exception of the HPI, all other review of systems was negative.     PHYSICAL EXAM:     Vitals: There were no vitals taken for this visit.  General: NAD, pleasant & cooperative  Head: EOMI, no facial lesions  Neuro: Normal sensation, no motor deficits  Skin: No ulcers, erythema, or skin breakdown  Psych: Alert, appropriate affect  MSK:  L ankle - no swelling. Neg thompson    IMAGING:       XR L ankle 12/04/22   No acute fracture or dislocation. The ankle mortise is intact. No significant soft tissue swelling.  There is ossification in the region of the deltoid ligament inferior to the medial malleolus, which may represent old trauma.       I reviewed the imaging.    ASSESSMENT:     Zachary Peck is a 27 y.o. male who presents with history and examination consistent with achilles tendon rupture sustained 2/7. Healing well, neg thompson.  Now 8 wk out so wiill wean CAM boot, advised no strengthening with resistance until 10 wk after injury. Continue PT. Follow up in 6-8 wk for final rom/strength check. No sports.     PLAN  Cast for 2 to 3 weeks in plantarflexion, then transition to CAM walker boot w wedges  Non-weight bearing (NWB) for 4 weeks after injury  Started WB at 5 wk w wedges.  Wean out of wedges in CAM boot, 1 per week starting at 5 to 6 weeks after injury  Begin physical therapy at ~5 to 6 weeks after injury, dorsiflexion limited to maximum of neutral position  May wean out of the CAM walker boot at 6 to 8 weeks after injury   Begin full range of motion (ROM),  including dorsiflexion past neutral at 6 to 8 weeks after injury   No strengthening with resistance until ~10 weeks after injury     RECOMMENDATIONS & PLAN:     Given our clinical suspicion of the above diagnosis, our initial plan is as follows:    Diagnoses and all orders for this visit:    Rupture of left Achilles tendon, subsequent encounter          Follow-Up: Return in about 6 weeks (around 03/12/2023).    Midge Aver, MD PhD  [facility]   Division of Sports Medicine  Department of Orthopedic Surgery    Time:  I spent a total of 30 minutes today to provide care for this patient.   This includes time spent prior to, during, and after the patient's appointment to:  Review the patient's past medical history as well as any relevant prior testing/laboratory/imaging results in preparation for the visit.   Obtain an adequate history and understanding of the patient's chief complaint.   Perform the necessary examination and/or order any tests, labs, or imaging for further evaluation.   Discuss the plan and any differential/diagnoses with the patient. Counsel/educate the patient if needed.    Coordinate care for the patient if needed.

## 2023-03-12 ENCOUNTER — Ambulatory Visit: Payer: BLUE CROSS/BLUE SHIELD

## 2023-03-12 DIAGNOSIS — S86012D Strain of left Achilles tendon, subsequent encounter: Secondary | ICD-10-CM

## 2023-03-12 NOTE — Progress Notes
[place] Department of Orthopaedic Surgery  Division of Sports Medicine    SPORTS MEDICINE CONSULTATION        Date: 03/12/2023    Referred by:  Steward Drone, Md  [address]  [id number]  [place],  [place] [address]  Primary Care Physician:  [facility] [place] [facility]    Chief Complaint:   Chief Complaint   Patient presents with    Ankle Pain       HISTORY:      Zachary Peck is a 27 y.o. male presenting with follow up for suspected L achilles tendon injury. Went to ER  (2/7) for acute injury playing intramural basketball. Diagnosed with likely achilles injury and splinted in plantar flexion. I placed him in plantar flexion for another week. Then transitioned to CAM boot w wedges, weaned wedges.  He presents for 15 wk follow up. He is doing well in PT. No pain.      Past Medical History: I have reviewed and confirmed the past medical history in the chart.  No past medical history on file.    Past Surgical History: I have reviewed and confirmed the past surgical history in the chart.  No past surgical history on file.    Medications: Reviewed medication list in the chart  No outpatient medications prior to visit.     No facility-administered medications prior to visit.       Allergies: Reviewed allergy section in the chart  No Known Allergies    Family History:   No family history on file.    Social History:   Social History     Socioeconomic History    Marital status: Single       Review of Systems: A 14-point review of systems was performed.  With the exception of the HPI, all other review of systems was negative.     PHYSICAL EXAM:     Vitals: There were no vitals taken for this visit.  General: NAD, pleasant & cooperative  Head: EOMI, no facial lesions  Neuro: Normal sensation, no motor deficits  Skin: No ulcers, erythema, or skin breakdown  Psych: Alert, appropriate affect  MSK:  LLE:  Neg thompson  No palpable defect over achilles  No ttp over achilles  Still some weakness with single leg raise    IMAGING:       XR L ankle 12/04/22   No acute fracture or dislocation. The ankle mortise is intact. No significant soft tissue swelling.  There is ossification in the region of the deltoid ligament inferior to the medial malleolus, which may represent old trauma.     I reviewed the imaging.    ASSESSMENT:     Zachary Peck is a 27 y.o. male who presents with history and examination consistent with achilles tendon rupture sustained 2/7. Healing well, neg thompson.  Continue PT to improve strength. Gradual return to activity.     RECOMMENDATIONS & PLAN:     Given our clinical suspicion of the above diagnosis, our initial plan is as follows:    Diagnoses and all orders for this visit:    Achilles tendon rupture, left, subsequent encounter  -     Referral to Rehabilitation, Physical Therapy            Follow-Up: Return if symptoms worsen or fail to improve.    Midge Aver, MD PhD  [facility]   Division of Sports Medicine  Department of Orthopedic Surgery    Time:  I spent a total of 30 minutes today to provide care for this patient.   This includes time spent prior to, during, and after the patient's appointment to:  Review the patient's past medical history as well as any relevant prior testing/laboratory/imaging results in preparation for the visit.   Obtain an adequate history and understanding of the patient's chief complaint.   Perform the necessary examination and/or order any tests, labs, or imaging for further evaluation.   Discuss the plan and any differential/diagnoses with the patient. Counsel/educate the patient if needed.    Coordinate care for the patient if needed.
